# Patient Record
Sex: Male | Born: 1950 | Race: White | Hispanic: No | Marital: Married | Smoking: Former smoker
Health system: Southern US, Community
[De-identification: ages and names within clinical notes are randomized; demographics above are authoritative.]

## PROBLEM LIST (undated history)

## (undated) DIAGNOSIS — Z974 Presence of external hearing-aid: Secondary | ICD-10-CM

## (undated) DIAGNOSIS — B1920 Unspecified viral hepatitis C without hepatic coma: Secondary | ICD-10-CM

## (undated) DIAGNOSIS — J449 Chronic obstructive pulmonary disease, unspecified: Secondary | ICD-10-CM

## (undated) DIAGNOSIS — G4730 Sleep apnea, unspecified: Secondary | ICD-10-CM

## (undated) DIAGNOSIS — C61 Malignant neoplasm of prostate: Secondary | ICD-10-CM

## (undated) DIAGNOSIS — K219 Gastro-esophageal reflux disease without esophagitis: Secondary | ICD-10-CM

## (undated) DIAGNOSIS — G43909 Migraine, unspecified, not intractable, without status migrainosus: Secondary | ICD-10-CM

## (undated) HISTORY — PX: TOTAL KNEE ARTHROPLASTY: SHX125

## (undated) HISTORY — PX: HERNIA REPAIR: SHX51

## (undated) HISTORY — PX: PROSTATE SURGERY: SHX751

## (undated) HISTORY — PX: SEPTOPLASTY: SUR1290

## (undated) HISTORY — PX: HYDROCELE EXCISION / REPAIR: SUR1145

## (undated) HISTORY — PX: CARDIAC ELECTROPHYSIOLOGY STUDY AND ABLATION: SHX1294

---

## 2001-12-10 DIAGNOSIS — B1920 Unspecified viral hepatitis C without hepatic coma: Secondary | ICD-10-CM

## 2001-12-10 HISTORY — DX: Unspecified viral hepatitis C without hepatic coma: B19.20

## 2007-08-05 ENCOUNTER — Ambulatory Visit: Payer: Self-pay | Admitting: Urology

## 2007-08-06 ENCOUNTER — Ambulatory Visit: Payer: Self-pay | Admitting: Urology

## 2008-01-28 IMAGING — CT CT ABDOMEN AND PELVIS WITHOUT AND WITH CONTRAST
2 of 4 series · 13 of 32 positions shown, 18 images · non-contrast
Comparison: none

REASON FOR EXAM: prostate cancer
COMMENTS:

[Series 4: soft tissue with · axial · 0.89mm/px · z∈[-828,-473]mm · 8 of 93 slices shown, 13 images]
[im 11/93  soft-tissue]
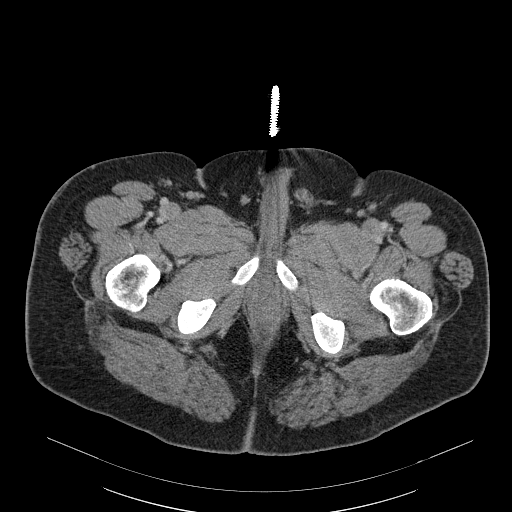
[im 11/93  bone]
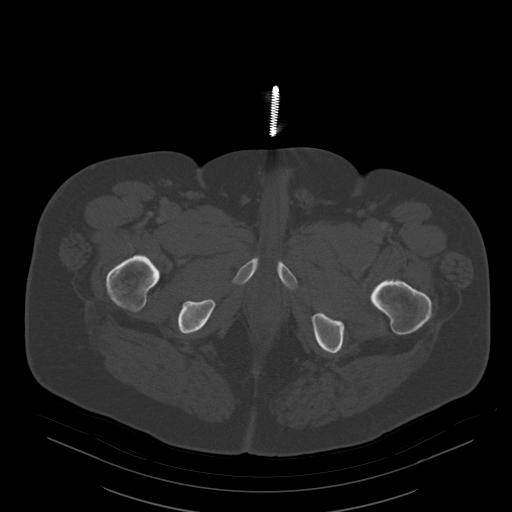
[im 21/93  soft-tissue]
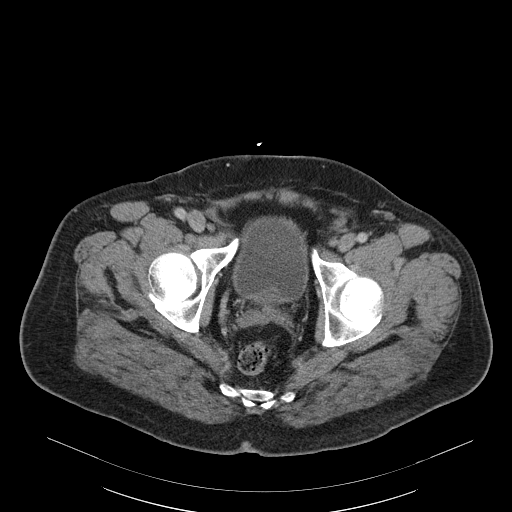
[im 31/93  soft-tissue]
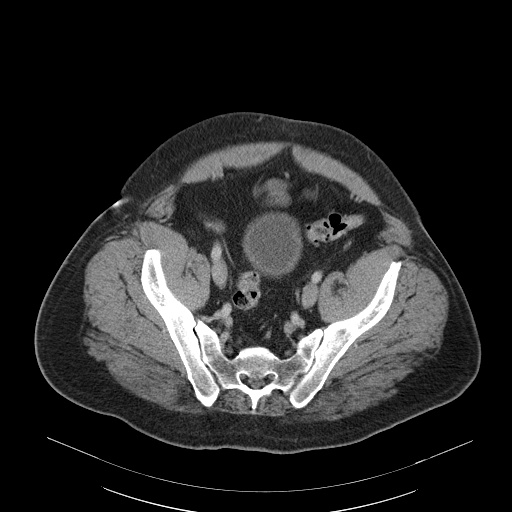
[im 41/93  soft-tissue]
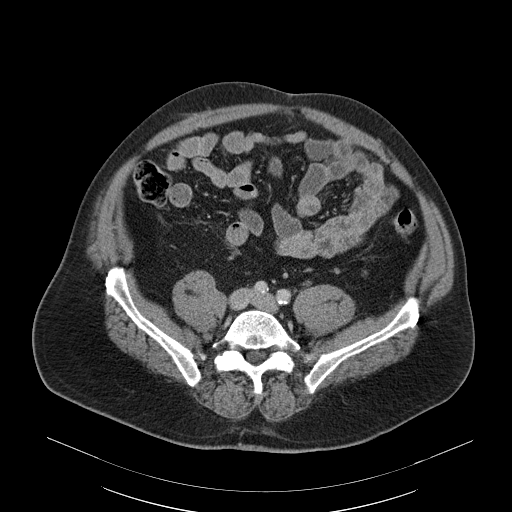
[im 52/93  soft-tissue]
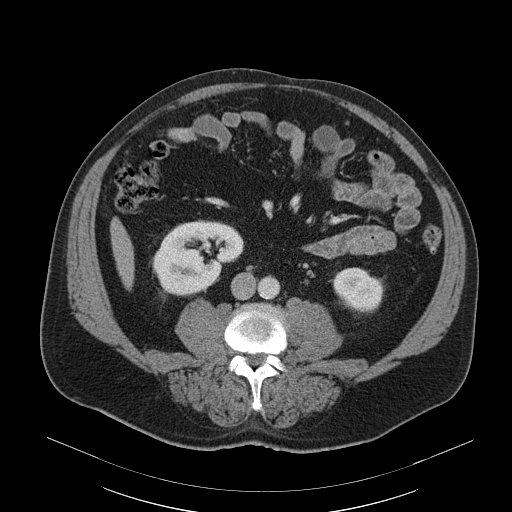
[im 52/93  lung]
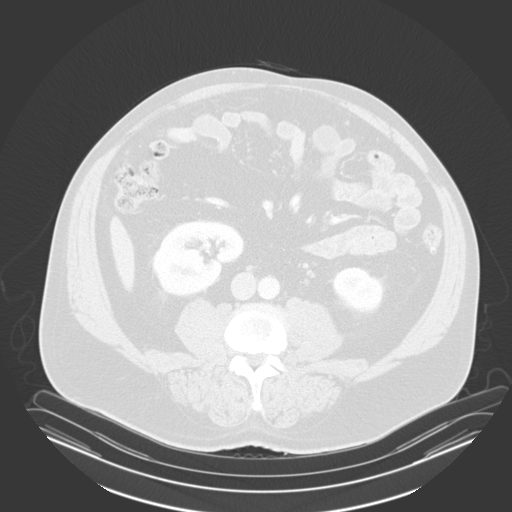
[im 62/93  soft-tissue]
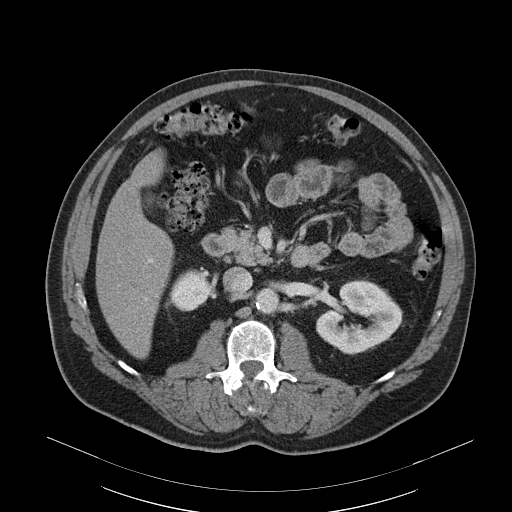
[im 62/93  lung]
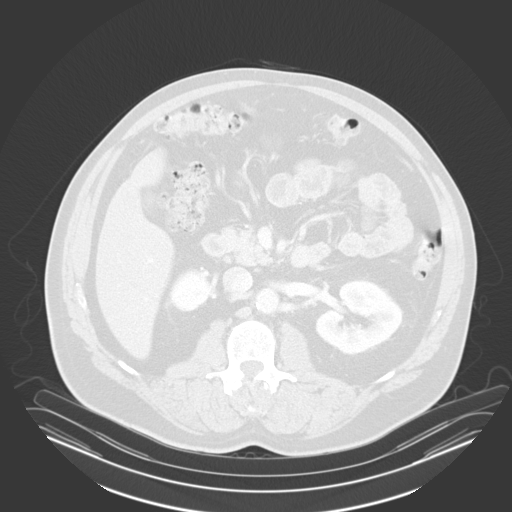
[im 72/93  soft-tissue]
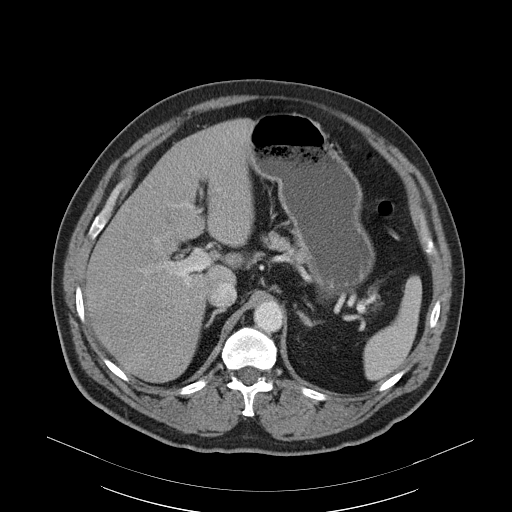
[im 72/93  lung]
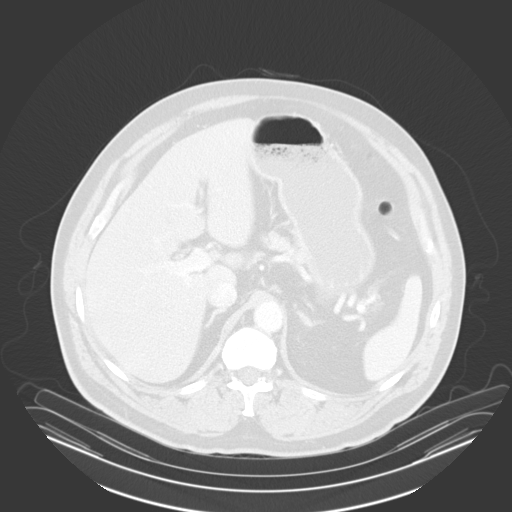
[im 82/93  soft-tissue]
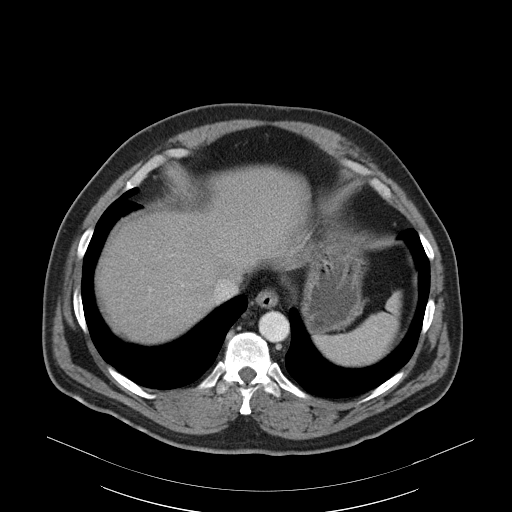
[im 82/93  lung]
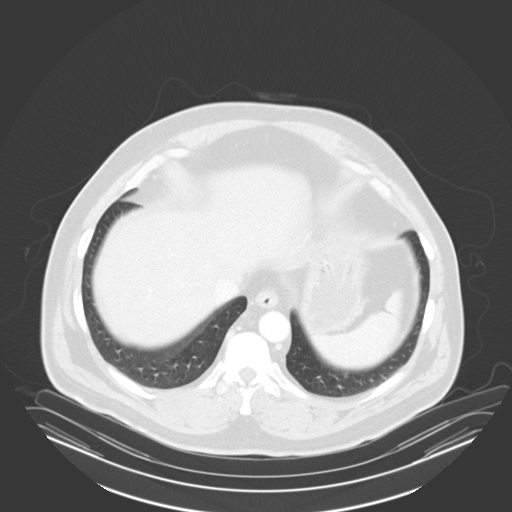

[Series 5: soft tissue delay · axial · delayed · 0.89mm/px · z∈[-828,-623]mm · 5 of 93 slices shown]
[im 11/93  soft-tissue]
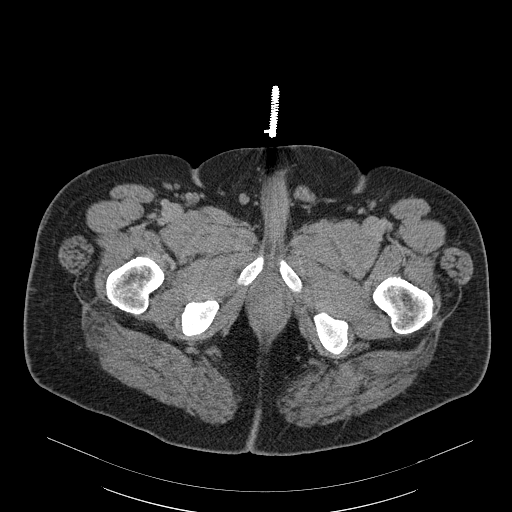
[im 21/93  soft-tissue]
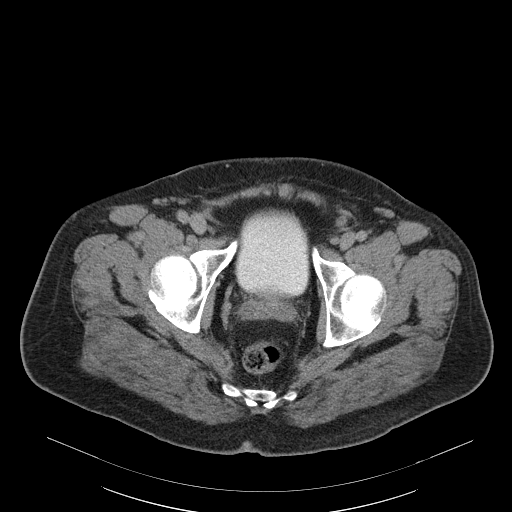
[im 31/93  soft-tissue]
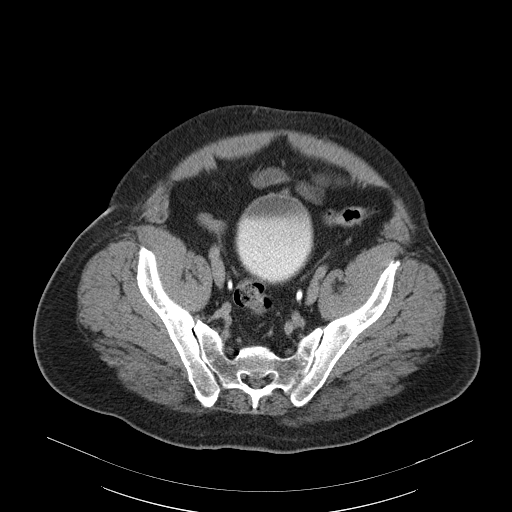
[im 41/93  soft-tissue]
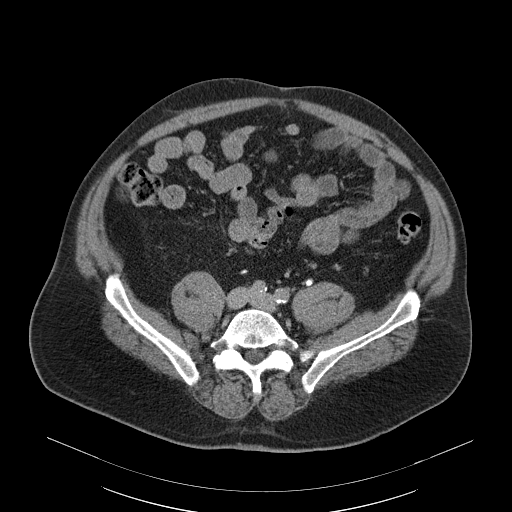
[im 52/93  soft-tissue]
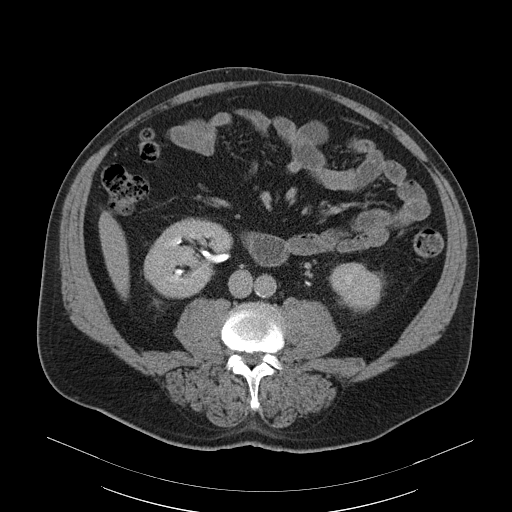

[13 of 32 positions shown; findings below may reference images not displayed]

PROCEDURE:     FANT - FANT ABDOMEN / PELVIS W/WO  - August 06, 2007 [DATE]

RESULT:     The patient has a history of prostatic malignancy. A triphasic
scan was performed as requested. The patient received 100 ml of [id number]
for the postcontrast portions of the exam.

On the pre-contrast images the kidneys exhibit no evidence of obstruction.
No calcified stones are identified. Following contrast administration the
renal parenchyma enhances well. No focal masses are identified. On the
delayed images density of the renal parenchyma is normal. The intrarenal
collecting systems are normal and the ureters are normal in course and
caliber. The prostate gland produces a moderate impression upon the urinary
bladder base. No abnormal enhancement of the urinary bladder wall is seen
and on the delayed images the partially contrast filled urinary bladder is
normal in appearance. I do not see evidence of pelvic lymphadenopathy. No
bulky inguinal lymphadenopathy is identified either. There is no evidence of
ascites. The unopacified loops of small and large bowel exhibit no acute
abnormality. There is a structure demonstrated consistent with a normal
appendix.

The caliber of the abdominal aorta is normal. The stomach is partially
distended with fluid, but grossly normal. The gallbladder is contracted. The
liver, pancreas, and spleen are normal in appearance. There are no adrenal
masses. The lung bases exhibit no acute abnormality. There is evidence of
prior granulomatous infection of the lungs.
IMPRESSION: 1. I do not see acute urinary tract abnormality. The prostate gland is
mildly enlarged and produces a moderate impression upon the urinary bladder
base. No hydronephrosis is seen.
2. The gallbladder is contracted but no calcified stones are demonstrated.
3. I see no acute abnormality of the bowel.
4. There is no evidence of an inguinal or umbilical hernia.
5. The lumbar vertebral bodies are preserved in height. Mild disc space
narrowing is noted in the mid and lower lumbar spine. I do not see lytic or
blastic bony lesions.

## 2008-09-29 ENCOUNTER — Ambulatory Visit: Payer: Self-pay

## 2008-10-12 ENCOUNTER — Ambulatory Visit: Payer: Self-pay | Admitting: Gastroenterology
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
## 2009-11-02 ENCOUNTER — Ambulatory Visit: Payer: Self-pay | Admitting: Family Medicine

## 2009-11-18 ENCOUNTER — Ambulatory Visit: Payer: Self-pay | Admitting: Internal Medicine

## 2010-05-12 IMAGING — CR DG CHEST 2V
1 series · 2 of 2 positions shown · non-contrast
Comparison: none

REASON FOR EXAM: cough
COMMENTS:

PROCEDURE:     MDR - MDR CHEST PA(OR AP) AND LATERAL  - November 18, 2009  [DATE]
RESULT:     The lungs are clear. The cardiac silhouette and visualized bony
skeleton are unremarkable. Calcified lymph nodes identified within the left
hilar region.

[Series 1: view not recorded · 0.17mm/px · 2 of 2 slices shown]
[im 1/2]
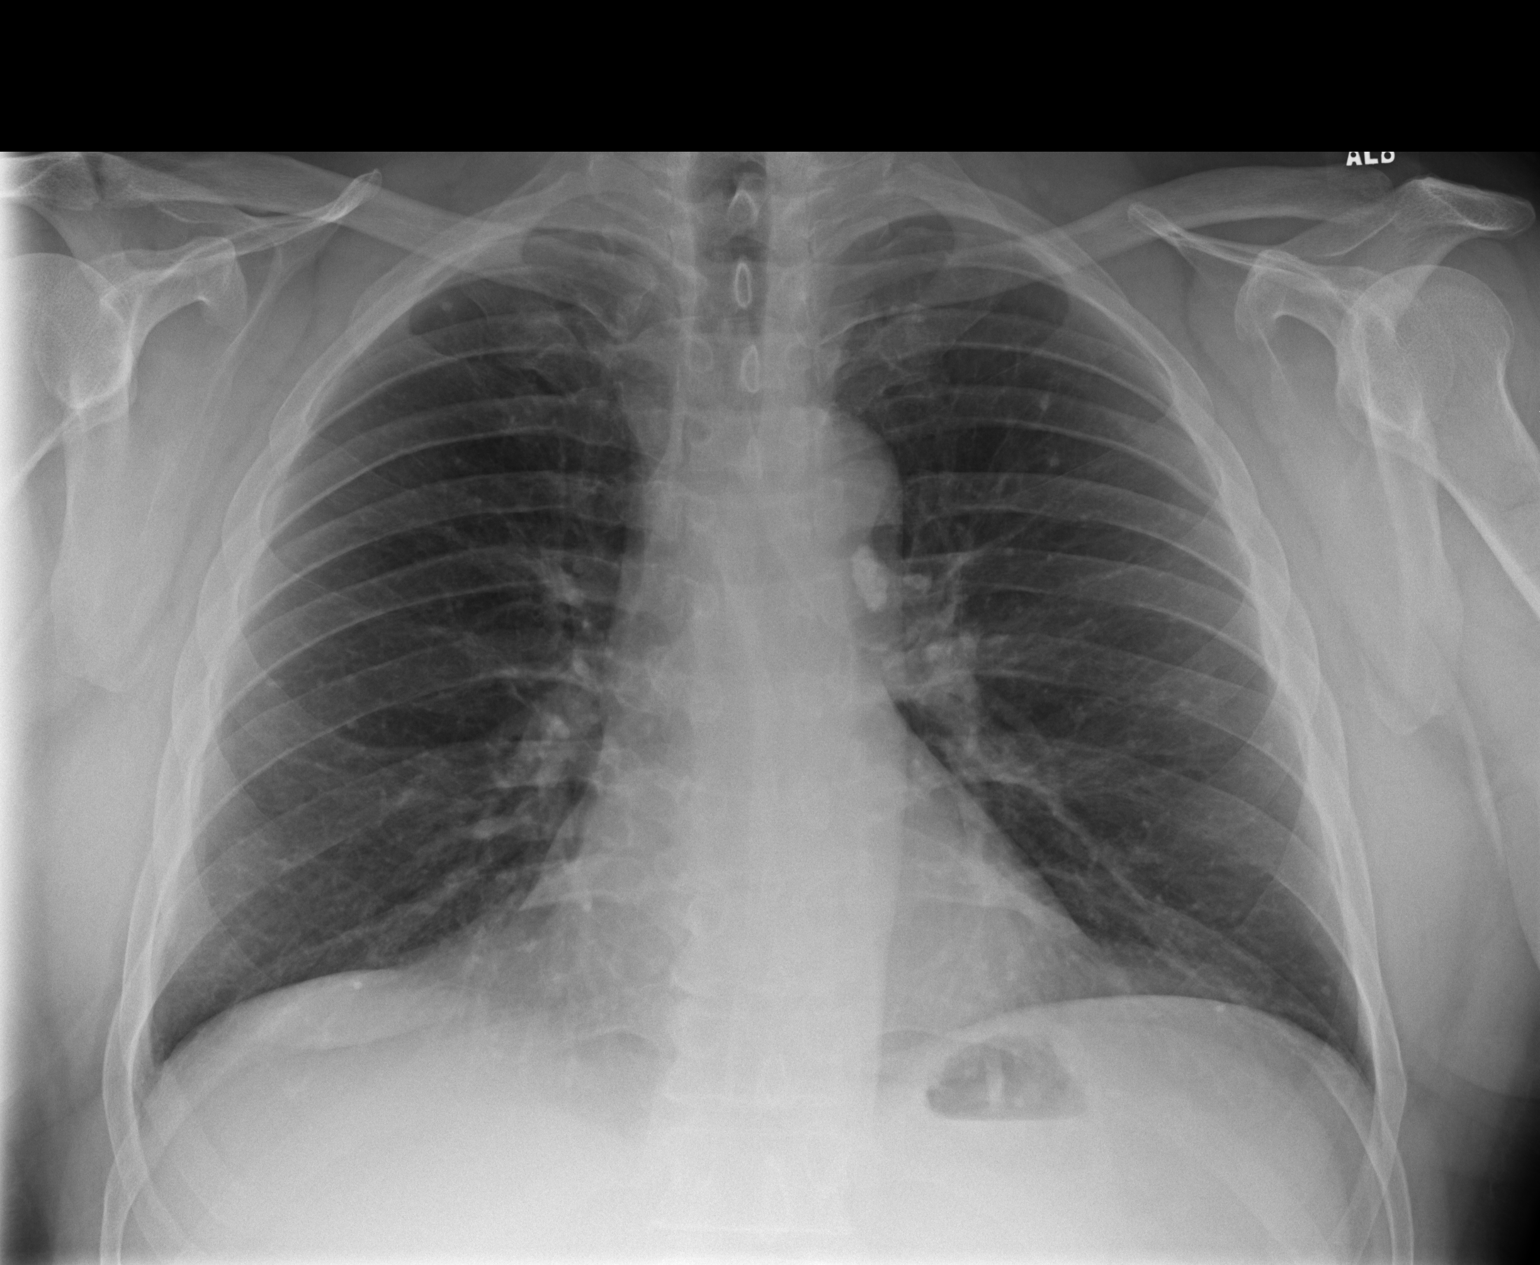
[im 2/2]
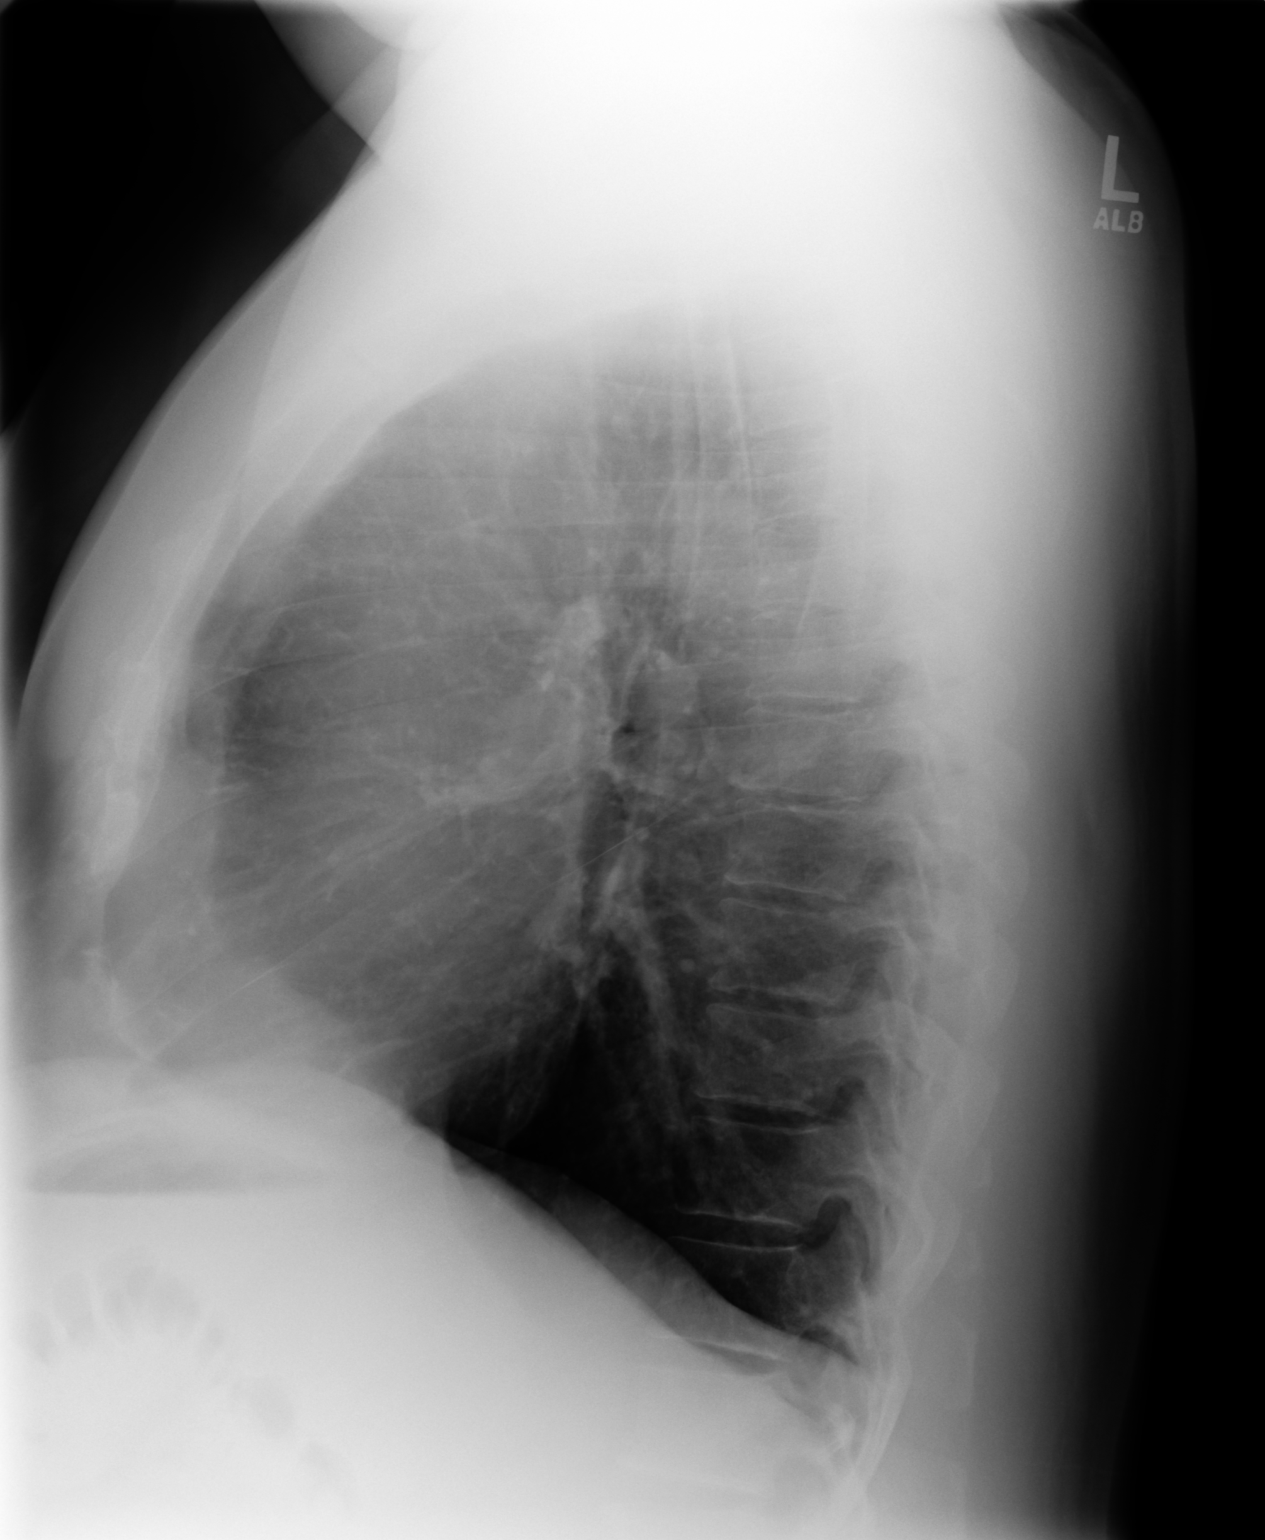

[2 of 2 positions shown; findings below may reference images not displayed]

IMPRESSION: 1. Chest radiograph without evidence of acute cardiopulmonary disease.

## 2013-01-12 ENCOUNTER — Ambulatory Visit: Payer: Self-pay

## 2013-04-06 DIAGNOSIS — C61 Malignant neoplasm of prostate: Secondary | ICD-10-CM | POA: Insufficient documentation

## 2013-05-12 DIAGNOSIS — H9190 Unspecified hearing loss, unspecified ear: Secondary | ICD-10-CM | POA: Insufficient documentation

## 2013-05-12 DIAGNOSIS — G43909 Migraine, unspecified, not intractable, without status migrainosus: Secondary | ICD-10-CM | POA: Insufficient documentation

## 2013-05-12 DIAGNOSIS — B1920 Unspecified viral hepatitis C without hepatic coma: Secondary | ICD-10-CM | POA: Insufficient documentation

## 2014-03-16 DIAGNOSIS — M1712 Unilateral primary osteoarthritis, left knee: Secondary | ICD-10-CM | POA: Insufficient documentation

## 2014-10-15 DIAGNOSIS — S83231A Complex tear of medial meniscus, current injury, right knee, initial encounter: Secondary | ICD-10-CM | POA: Insufficient documentation

## 2015-01-19 DIAGNOSIS — M84376A Stress fracture, unspecified foot, initial encounter for fracture: Secondary | ICD-10-CM | POA: Insufficient documentation

## 2015-01-19 DIAGNOSIS — M722 Plantar fascial fibromatosis: Secondary | ICD-10-CM | POA: Insufficient documentation

## 2016-06-21 DIAGNOSIS — J449 Chronic obstructive pulmonary disease, unspecified: Secondary | ICD-10-CM | POA: Insufficient documentation

## 2018-07-10 ENCOUNTER — Telehealth: Payer: Self-pay | Admitting: Gastroenterology

## 2018-07-10 NOTE — Telephone Encounter (Signed)
Patient LVM that he needed to schedule a colonoscopy and EGD with Dr. Allen Norris

## 2018-07-14 ENCOUNTER — Other Ambulatory Visit: Payer: Self-pay

## 2018-07-14 DIAGNOSIS — Z8719 Personal history of other diseases of the digestive system: Secondary | ICD-10-CM

## 2018-07-14 DIAGNOSIS — Z1211 Encounter for screening for malignant neoplasm of colon: Secondary | ICD-10-CM

## 2018-08-26 ENCOUNTER — Encounter: Payer: Self-pay | Admitting: *Deleted

## 2018-08-26 ENCOUNTER — Other Ambulatory Visit: Payer: Self-pay

## 2018-08-27 ENCOUNTER — Other Ambulatory Visit: Payer: Self-pay

## 2018-08-27 DIAGNOSIS — J45909 Unspecified asthma, uncomplicated: Secondary | ICD-10-CM | POA: Insufficient documentation

## 2018-08-27 DIAGNOSIS — B399 Histoplasmosis, unspecified: Secondary | ICD-10-CM | POA: Insufficient documentation

## 2018-08-27 DIAGNOSIS — K219 Gastro-esophageal reflux disease without esophagitis: Secondary | ICD-10-CM | POA: Insufficient documentation

## 2018-08-27 DIAGNOSIS — R000 Tachycardia, unspecified: Secondary | ICD-10-CM | POA: Insufficient documentation

## 2018-08-27 DIAGNOSIS — G4730 Sleep apnea, unspecified: Secondary | ICD-10-CM | POA: Insufficient documentation

## 2018-08-28 ENCOUNTER — Other Ambulatory Visit: Payer: Self-pay

## 2018-08-28 NOTE — Discharge Instructions (Signed)
General Anesthesia, Adult, Care After °These instructions provide you with information about caring for yourself after your procedure. Your health care provider may also give you more specific instructions. Your treatment has been planned according to current medical practices, but problems sometimes occur. Call your health care provider if you have any problems or questions after your procedure. °What can I expect after the procedure? °After the procedure, it is common to have: °· Vomiting. °· A sore throat. °· Mental slowness. ° °It is common to feel: °· Nauseous. °· Cold or shivery. °· Sleepy. °· Tired. °· Sore or achy, even in parts of your body where you did not have surgery. ° °Follow these instructions at home: °For at least 24 hours after the procedure: °· Do not: °? Participate in activities where you could fall or become injured. °? Drive. °? Use heavy machinery. °? Drink alcohol. °? Take sleeping pills or medicines that cause drowsiness. °? Make important decisions or sign legal documents. °? Take care of children on your own. °· Rest. °Eating and drinking °· If you vomit, drink water, juice, or soup when you can drink without vomiting. °· Drink enough fluid to keep your urine clear or pale yellow. °· Make sure you have little or no nausea before eating solid foods. °· Follow the diet recommended by your health care provider. °General instructions °· Have a responsible adult stay with you until you are awake and alert. °· Return to your normal activities as told by your health care provider. Ask your health care provider what activities are safe for you. °· Take over-the-counter and prescription medicines only as told by your health care provider. °· If you smoke, do not smoke without supervision. °· Keep all follow-up visits as told by your health care provider. This is important. °Contact a health care provider if: °· You continue to have nausea or vomiting at home, and medicines are not helpful. °· You  cannot drink fluids or start eating again. °· You cannot urinate after 8-12 hours. °· You develop a skin rash. °· You have fever. °· You have increasing redness at the site of your procedure. °Get help right away if: °· You have difficulty breathing. °· You have chest pain. °· You have unexpected bleeding. °· You feel that you are having a life-threatening or urgent problem. °This information is not intended to replace advice given to you by your health care provider. Make sure you discuss any questions you have with your health care provider. °Document Released: 03/04/2001 Document Revised: 04/30/2016 Document Reviewed: 11/10/2015 °Elsevier Interactive Patient Education © 2018 [employer] ° °

## 2018-09-01 ENCOUNTER — Ambulatory Visit: Payer: No Typology Code available for payment source | Admitting: Anesthesiology

## 2018-09-01 ENCOUNTER — Ambulatory Visit
Admission: RE | Admit: 2018-09-01 | Discharge: 2018-09-01 | Disposition: A | Discharge status: Home / Self Care | Payer: No Typology Code available for payment source | Source: Ambulatory Visit | Attending: Gastroenterology | Admitting: Gastroenterology

## 2018-09-01 ENCOUNTER — Encounter
Admission: RE | Disposition: A | Discharge status: Home / Self Care | Payer: Self-pay | Source: Ambulatory Visit | Attending: Gastroenterology

## 2018-09-01 DIAGNOSIS — Z8546 Personal history of malignant neoplasm of prostate: Secondary | ICD-10-CM | POA: Insufficient documentation

## 2018-09-01 DIAGNOSIS — I8500 Esophageal varices without bleeding: Secondary | ICD-10-CM

## 2018-09-01 DIAGNOSIS — B1920 Unspecified viral hepatitis C without hepatic coma: Secondary | ICD-10-CM | POA: Diagnosis not present

## 2018-09-01 DIAGNOSIS — Z6836 Body mass index (BMI) 36.0-36.9, adult: Secondary | ICD-10-CM | POA: Diagnosis not present

## 2018-09-01 DIAGNOSIS — K219 Gastro-esophageal reflux disease without esophagitis: Secondary | ICD-10-CM | POA: Insufficient documentation

## 2018-09-01 DIAGNOSIS — K621 Rectal polyp: Secondary | ICD-10-CM | POA: Insufficient documentation

## 2018-09-01 DIAGNOSIS — K5730 Diverticulosis of large intestine without perforation or abscess without bleeding: Secondary | ICD-10-CM | POA: Insufficient documentation

## 2018-09-01 DIAGNOSIS — J449 Chronic obstructive pulmonary disease, unspecified: Secondary | ICD-10-CM | POA: Diagnosis not present

## 2018-09-01 DIAGNOSIS — E669 Obesity, unspecified: Secondary | ICD-10-CM | POA: Insufficient documentation

## 2018-09-01 DIAGNOSIS — K7469 Other cirrhosis of liver: Secondary | ICD-10-CM | POA: Diagnosis not present

## 2018-09-01 DIAGNOSIS — G4730 Sleep apnea, unspecified: Secondary | ICD-10-CM | POA: Diagnosis not present

## 2018-09-01 DIAGNOSIS — Z87891 Personal history of nicotine dependence: Secondary | ICD-10-CM | POA: Insufficient documentation

## 2018-09-01 DIAGNOSIS — K7460 Unspecified cirrhosis of liver: Secondary | ICD-10-CM | POA: Diagnosis not present

## 2018-09-01 DIAGNOSIS — Z79899 Other long term (current) drug therapy: Secondary | ICD-10-CM | POA: Diagnosis not present

## 2018-09-01 DIAGNOSIS — Z1211 Encounter for screening for malignant neoplasm of colon: Secondary | ICD-10-CM

## 2018-09-01 DIAGNOSIS — Z8719 Personal history of other diseases of the digestive system: Secondary | ICD-10-CM

## 2018-09-01 DIAGNOSIS — M1990 Unspecified osteoarthritis, unspecified site: Secondary | ICD-10-CM | POA: Insufficient documentation

## 2018-09-01 DIAGNOSIS — Z9989 Dependence on other enabling machines and devices: Secondary | ICD-10-CM | POA: Insufficient documentation

## 2018-09-01 HISTORY — PX: POLYPECTOMY: SHX5525

## 2018-09-01 HISTORY — DX: Migraine, unspecified, not intractable, without status migrainosus: G43.909

## 2018-09-01 HISTORY — DX: Gastro-esophageal reflux disease without esophagitis: K21.9

## 2018-09-01 HISTORY — PX: COLONOSCOPY WITH PROPOFOL: SHX5780

## 2018-09-01 HISTORY — DX: Malignant neoplasm of prostate: C61

## 2018-09-01 HISTORY — PX: ESOPHAGOGASTRODUODENOSCOPY (EGD) WITH PROPOFOL: SHX5813

## 2018-09-01 HISTORY — DX: Presence of external hearing-aid: Z97.4

## 2018-09-01 HISTORY — DX: Unspecified viral hepatitis C without hepatic coma: B19.20

## 2018-09-01 HISTORY — DX: Chronic obstructive pulmonary disease, unspecified: J44.9

## 2018-09-01 HISTORY — DX: Sleep apnea, unspecified: G47.30

## 2018-09-01 SURGERY — COLONOSCOPY WITH PROPOFOL
Anesthesia: General | Site: Throat

## 2018-09-01 MED ORDER — LACTATED RINGERS IV SOLN
1000.0000 mL | INTRAVENOUS | Status: DC
  Administered 2018-09-01: 1000 mL via INTRAVENOUS

## 2018-09-01 MED ORDER — LIDOCAINE HCL (CARDIAC) PF 100 MG/5ML IV SOSY
PREFILLED_SYRINGE | INTRAVENOUS | Status: DC | PRN
  Administered 2018-09-01: 40 mg via INTRAVENOUS

## 2018-09-01 MED ORDER — STERILE WATER FOR IRRIGATION IR SOLN
Status: DC | PRN
  Administered 2018-09-01: .05 mL

## 2018-09-01 MED ORDER — GLYCOPYRROLATE 0.2 MG/ML IJ SOLN
INTRAMUSCULAR | Status: DC | PRN
  Administered 2018-09-01: 0.1 mg via INTRAVENOUS

## 2018-09-01 MED ORDER — PROPOFOL 10 MG/ML IV BOLUS
INTRAVENOUS | Status: DC | PRN
  Administered 2018-09-01: 100 mg via INTRAVENOUS
  Administered 2018-09-01: 20 mg via INTRAVENOUS
  Administered 2018-09-01: 40 mg via INTRAVENOUS
  Administered 2018-09-01: 20 mg via INTRAVENOUS
  Administered 2018-09-01: 50 mg via INTRAVENOUS
  Administered 2018-09-01: 20 mg via INTRAVENOUS

## 2018-09-01 MED ORDER — ONDANSETRON HCL 4 MG/2ML IJ SOLN: 4.0000 mg | Freq: Once | INTRAMUSCULAR | Status: DC | PRN

## 2018-09-01 MED ORDER — SODIUM CHLORIDE 0.9 % IV SOLN: INTRAVENOUS | Status: DC

## 2018-09-01 SURGICAL SUPPLY — 7 items
BLOCK BITE 60FR ADLT L/F GRN (MISCELLANEOUS) ×5 IMPLANT
CANISTER SUCT 1200ML W/VALVE (MISCELLANEOUS) ×5 IMPLANT
FORCEPS BIOP RAD 4 LRG CAP 4 (CUTTING FORCEPS) ×5 IMPLANT
GOWN CVR UNV OPN BCK APRN NK (MISCELLANEOUS) ×6 IMPLANT
GOWN ISOL THUMB LOOP REG UNIV (MISCELLANEOUS) ×4
KIT ENDO PROCEDURE OLY (KITS) ×5 IMPLANT
WATER STERILE IRR 250ML POUR (IV SOLUTION) ×5 IMPLANT

## 2018-09-01 NOTE — Transfer of Care (Addendum)
Immediate Anesthesia Transfer of Care Note  Patient: Paul Larson  Procedure(s) Performed: COLONOSCOPY WITH PROPOFOL (N/A Rectum) ESOPHAGOGASTRODUODENOSCOPY (EGD) WITH PROPOFOL (N/A Throat) POLYPECTOMY (Rectum)  Patient Location: PACU  Anesthesia Type: General  Level of Consciousness: awake, alert  and patient cooperative  Airway and Oxygen Therapy: Patient Spontanous Breathing and Patient connected to supplemental oxygen  Post-op Assessment: Post-op Vital signs reviewed, Patient's Cardiovascular Status Stable, Respiratory Function Stable, Patent Airway and No signs of Nausea or vomiting  Post-op Vital Signs: Reviewed and stable  Complications: No apparent anesthesia complications

## 2018-09-01 NOTE — Op Note (Signed)
[facility] Patient Name: Paul Larson Procedure Date: 09/01/2018 8:38 AM MRN: [id number] Account #: [id number] Date of Birth: 12-20-1950 Admit Type: Outpatient Age: 67 Room: [facility] OR ROOM 01 Gender: Male Note Status: Finalized Procedure:            Upper GI endoscopy Indications:          Cirrhosis rule out esophageal varices Providers:            Lucilla Lame MD, MD Referring MD:         [facility], MD (Referring MD) Medicines:            Propofol per Anesthesia Complications:        No immediate complications. Procedure:            Pre-Anesthesia Assessment:                       - Prior to the procedure, a History and Physical was                        performed, and patient medications and allergies were                        reviewed. The patient's tolerance of previous                        anesthesia was also reviewed. The risks and benefits of                        the procedure and the sedation options and risks were                        discussed with the patient. All questions were                        answered, and informed consent was obtained. Prior                        Anticoagulants: The patient has taken no previous                        anticoagulant or antiplatelet agents. ASA Grade                        Assessment: II - A patient with mild systemic disease.                        After reviewing the risks and benefits, the patient was                        deemed in satisfactory condition to undergo the                        procedure.                       After obtaining informed consent, the endoscope was                        passed under direct vision. Throughout the procedure,  the patient's blood pressure, pulse, and oxygen                        saturations were monitored continuously. The was                        introduced through the mouth, and advanced to the                      second part of duodenum. The upper GI endoscopy was                        accomplished without difficulty. The patient tolerated                        the procedure well. Findings:      The esophagus was normal.      The stomach was normal.      The examined duodenum was normal. Impression:           - Normal esophagus.                       - Normal stomach.                       - Normal examined duodenum.                       - No specimens collected. Recommendation:       - Discharge patient to home.                       - Resume previous diet.                       - Continue present medications.                       - Perform a colonoscopy today.                       - Repeat upper endoscopy in 2 years for surveillance. Procedure Code(s):    --- Professional ---                       [phone], Esophagogastroduodenoscopy, flexible, transoral;                        diagnostic, including collection of specimen(s) by                        brushing or washing, when performed (separate procedure) Diagnosis Code(s):    --- Professional ---                       K74.60, Unspecified cirrhosis of liver CPT copyright 2017 American Medical Association. All rights reserved. The codes documented in this report are preliminary and upon coder review may  be revised to meet current compliance requirements. Lucilla Lame MD, MD 09/01/2018 8:49:22 AM This report has been signed electronically. Number of Addenda: 0 Note Initiated On: 09/01/2018 8:38 AM Total Procedure Duration: 0 hours 1 minute 34 seconds       [facility]

## 2018-09-01 NOTE — H&P (Signed)
Lucilla Lame, MD [address] [place], [place] [address] [phone] Fax : [phone]  Primary Care Physician:  [facility] Primary Gastroenterologist:  Dr. Allen Norris  Pre-Procedure History & Physical: HPI:  Paul Larson is a 67 y.o. male is here for an endoscopy and colonoscopy.   Past Medical History:  Diagnosis Date  . COPD (chronic obstructive pulmonary disease) ([place])   . GERD (gastroesophageal reflux disease)   . Hepatitis C 2003   took treatment  . Migraine headache   . Prostate cancer ([place])   . Sleep apnea    CPAP  . Wears hearing aid in both ears     Past Surgical History:  Procedure Laterality Date  . CARDIAC ELECTROPHYSIOLOGY STUDY AND ABLATION    . HERNIA REPAIR     umbilical  . HYDROCELE EXCISION / REPAIR    . PROSTATE SURGERY    . SEPTOPLASTY    . TOTAL KNEE ARTHROPLASTY Left     Prior to Admission medications   Medication Sig Start Date End Date Taking? Authorizing Provider  albuterol (PROVENTIL HFA;VENTOLIN HFA) 108 (90 Base) MCG/ACT inhaler Inhale into the lungs every 6 (six) hours as needed for wheezing or shortness of breath.   Yes [provider]  MILK THISTLE PO Take 525 mg by mouth daily.   Yes [provider]  Multiple Vitamin (MULTIVITAMIN) tablet Take 1 tablet by mouth daily.   Yes [provider]  Omega-3 Fatty Acids (OMEGA 3 PO) Take by mouth daily.   Yes [provider]  propranolol (INDERAL) 40 MG tablet Take 20 mg by mouth daily.   Yes [provider]  SUMAtriptan (IMITREX) 100 MG tablet Take 100 mg by mouth every 2 (two) hours as needed for migraine. May repeat in 2 hours if headache persists or recurs.   Yes [provider]  Tiotropium Bromide-Olodaterol (STIOLTO RESPIMAT) 2.5-2.5 MCG/ACT AERS Inhale into the lungs daily.   Yes [provider]    Allergies as of 07/14/2018  . (Not on File)    History reviewed. No pertinent family  history.  Social History   Socioeconomic History  . Marital status: Married    Spouse name: Not on file  . Number of children: Not on file  . Years of education: Not on file  . Highest education level: Not on file  Occupational History  . Not on file  Social Needs  . Financial resource strain: Not on file  . Food insecurity:    Worry: Not on file    Inability: Not on file  . Transportation needs:    Medical: Not on file    Non-medical: Not on file  Tobacco Use  . Smoking status: Former Smoker    Last attempt to quit: 05/24/2004    Years since quitting: 14.2  . Smokeless tobacco: Never Used  Substance and Sexual Activity  . Alcohol use: Not Currently    Comment: none since 2001  . Drug use: Not on file  . Sexual activity: Not on file  Lifestyle  . Physical activity:    Days per week: Not on file    Minutes per session: Not on file  . Stress: Not on file  Relationships  . Social connections:    Talks on phone: Not on file    Gets together: Not on file    Attends religious service: Not on file    Active member of club or organization: Not on file    Attends meetings  of clubs or organizations: Not on file    Relationship status: Not on file  . Intimate partner violence:    Fear of current or ex partner: Not on file    Emotionally abused: Not on file    Physically abused: Not on file    Forced sexual activity: Not on file  Other Topics Concern  . Not on file  Social History Narrative  . Not on file    Review of Systems: See HPI, otherwise negative ROS  Physical Exam: BP (!) 140/96   Pulse 92   Temp 97.7 F (36.5 C) (Temporal)   Resp 16   Ht 5\' 10"  (1.778 m)   Wt 114.3 kg   SpO2 98%   BMI 36.16 kg/m  General:   Alert,  pleasant and cooperative in NAD Head:  Normocephalic and atraumatic. Neck:  Supple; no masses or thyromegaly. Lungs:  Clear throughout to auscultation.    Heart:  Regular rate and rhythm. Abdomen:  Soft, nontender and nondistended.  Normal bowel sounds, without guarding, and without rebound.   Neurologic:  Alert and  oriented x4;  grossly normal neurologically.  Impression/Plan: Paul Larson is here for an endoscopy and colonoscopy to be performed for cirrhosis and screening colonoscopy.  Risks, benefits, limitations, and alternatives regarding  endoscopy and colonoscopy have been reviewed with the patient.  Questions have been answered.  All parties agreeable.   Lucilla Lame, MD  09/01/2018, 8:04 AM

## 2018-09-01 NOTE — Op Note (Signed)
[facility] Patient Name: Paul Larson Procedure Date: 09/01/2018 8:38 AM MRN: [id number] Account #: [id number] Date of Birth: [birth year] Admit Type: Outpatient Age: 67 Room: [facility] OR ROOM 01 Gender: Male Note Status: Finalized Procedure:            Colonoscopy Indications:          Screening for colorectal malignant neoplasm Providers:            Lucilla Lame MD, MD Medicines:            Propofol per Anesthesia Complications:        No immediate complications. Procedure:            Pre-Anesthesia Assessment:                       - Prior to the procedure, a History and Physical was                        performed, and patient medications and allergies were                        reviewed. The patient's tolerance of previous                        anesthesia was also reviewed. The risks and benefits of                        the procedure and the sedation options and risks were                        discussed with the patient. All questions were                        answered, and informed consent was obtained. Prior                        Anticoagulants: The patient has taken no previous                        anticoagulant or antiplatelet agents. ASA Grade                        Assessment: II - A patient with mild systemic disease.                        After reviewing the risks and benefits, the patient was                        deemed in satisfactory condition to undergo the                        procedure.                       After obtaining informed consent, the colonoscope was                        passed under direct vision. Throughout the procedure,                        the patient's blood pressure,  pulse, and oxygen                        saturations were monitored continuously. The was                        introduced through the anus and advanced to the the                        cecum, identified by appendiceal orifice and  ileocecal                        valve. The colonoscopy was performed without                        difficulty. The patient tolerated the procedure well.                        The quality of the bowel preparation was excellent. Findings:      The perianal and digital rectal examinations were normal.      Two sessile polyps were found in the rectum. The polyps were 1 to 3 mm       in size. These polyps were removed with a cold biopsy forceps. Resection       and retrieval were complete.      Multiple small-mouthed diverticula were found in the sigmoid colon. Impression:           - Two 1 to 3 mm polyps in the rectum, removed with a                        cold biopsy forceps. Resected and retrieved.                       - Diverticulosis in the sigmoid colon. Recommendation:       - Discharge patient to home.                       - Resume previous diet.                       - Continue present medications.                       - Await pathology results.                       - Repeat colonoscopy in 5 years if polyp adenoma and 10                        years if hyperplastic Procedure Code(s):    --- Professional ---                       [phone], Colonoscopy, flexible; with biopsy, single or                        multiple Diagnosis Code(s):    --- Professional ---                       Z12.11, Encounter for screening for malignant neoplasm  of colon                       K62.1, Rectal polyp CPT copyright 2017 American Medical Association. All rights reserved. The codes documented in this report are preliminary and upon coder review may  be revised to meet current compliance requirements. Lucilla Lame MD, MD 09/01/2018 9:04:18 AM This report has been signed electronically. Number of Addenda: 0 Note Initiated On: 09/01/2018 8:38 AM Scope Withdrawal Time: 0 hours 8 minutes 19 seconds  Total Procedure Duration: 0 hours 12 minutes 6 seconds       [facility]

## 2018-09-01 NOTE — Anesthesia Procedure Notes (Signed)
Performed by: Dub Maclellan, CRNA Pre-anesthesia Checklist: Patient identified, Emergency Drugs available, Suction available, Timeout performed and Patient being monitored Patient Re-evaluated:Patient Re-evaluated prior to induction Oxygen Delivery Method: Nasal cannula Placement Confirmation: positive ETCO2       

## 2018-09-01 NOTE — Anesthesia Postprocedure Evaluation (Signed)
Anesthesia Post Note  Patient: Paul Larson  Procedure(s) Performed: COLONOSCOPY WITH PROPOFOL (N/A Rectum) ESOPHAGOGASTRODUODENOSCOPY (EGD) WITH PROPOFOL (N/A Throat) POLYPECTOMY (Rectum)  Patient location during evaluation: PACU Anesthesia Type: General Level of consciousness: awake Pain management: pain level controlled Vital Signs Assessment: post-procedure vital signs reviewed and stable Respiratory status: respiratory function stable Cardiovascular status: stable Postop Assessment: no signs of nausea or vomiting Anesthetic complications: no    Veda Canning

## 2018-09-01 NOTE — Anesthesia Preprocedure Evaluation (Addendum)
Anesthesia Evaluation  Patient identified by MRN, date of birth, ID band Patient awake    Reviewed: Allergy & Precautions, NPO status , Patient's Chart, lab work & pertinent test results  Airway Mallampati: II  TM Distance: >3 FB    Comment: Beard Dental  (+) Teeth Intact   Pulmonary sleep apnea and Continuous Positive Airway Pressure Ventilation , COPD, former smoker,    breath sounds clear to auscultation       Cardiovascular  Rhythm:Regular Rate:Normal     Neuro/Psych  Headaches,    GI/Hepatic GERD  ,(+) Hepatitis - (C, treated)  Endo/Other  Obesity - BMI 36   Renal/GU      Musculoskeletal  (+) Arthritis ,   Abdominal   Peds  Hematology   Anesthesia Other Findings   Reproductive/Obstetrics                            Anesthesia Physical Anesthesia Plan  ASA: III  Anesthesia Plan: General   Post-op Pain Management:    Induction: Intravenous  PONV Risk Score and Plan:   Airway Management Planned: Simple Face Mask and Natural Airway  Additional Equipment:   Intra-op Plan:   Post-operative Plan:   Informed Consent: I have reviewed the patients History and Physical, chart, labs and discussed the procedure including the risks, benefits and alternatives for the proposed anesthesia with the patient or authorized representative who has indicated his/her understanding and acceptance.   Dental advisory given  Plan Discussed with: CRNA  Anesthesia Plan Comments:         Anesthesia Quick Evaluation

## 2018-09-02 ENCOUNTER — Encounter: Payer: Self-pay | Admitting: Gastroenterology

## 2018-09-19 ENCOUNTER — Encounter: Payer: Self-pay | Admitting: Gastroenterology

## 2018-09-21 ENCOUNTER — Encounter: Payer: Self-pay | Admitting: Gastroenterology

## 2018-11-17 ENCOUNTER — Telehealth: Payer: Self-pay | Admitting: Gastroenterology

## 2018-11-17 NOTE — Telephone Encounter (Signed)
Pt wife is calling to receive 2 letters for pt that states  He had a procedure done on  9/23/2019so her insurance will cover it  On a [employer] letter head. She will come by and pick them up any question you can call pt wife

## 2018-11-18 NOTE — Telephone Encounter (Signed)
LVM for pt to return my call.

## 2018-11-20 NOTE — Telephone Encounter (Signed)
Pt's wife returned call and explained what kind of letter she was needing. Letter completed and put upfront for pickup.

## 2022-08-08 ENCOUNTER — Ambulatory Visit (INDEPENDENT_AMBULATORY_CARE_PROVIDER_SITE_OTHER): Payer: Medicare PPO | Admitting: Dermatology

## 2022-08-08 ENCOUNTER — Encounter: Payer: Self-pay | Admitting: Dermatology

## 2022-08-08 DIAGNOSIS — L570 Actinic keratosis: Secondary | ICD-10-CM

## 2022-08-08 DIAGNOSIS — D1800 Hemangioma unspecified site: Secondary | ICD-10-CM

## 2022-08-08 DIAGNOSIS — L578 Other skin changes due to chronic exposure to nonionizing radiation: Secondary | ICD-10-CM

## 2022-08-08 DIAGNOSIS — L814 Other melanin hyperpigmentation: Secondary | ICD-10-CM

## 2022-08-08 DIAGNOSIS — L609 Nail disorder, unspecified: Secondary | ICD-10-CM | POA: Diagnosis not present

## 2022-08-08 DIAGNOSIS — L820 Inflamed seborrheic keratosis: Secondary | ICD-10-CM | POA: Diagnosis not present

## 2022-08-08 DIAGNOSIS — D229 Melanocytic nevi, unspecified: Secondary | ICD-10-CM

## 2022-08-08 DIAGNOSIS — Z1283 Encounter for screening for malignant neoplasm of skin: Secondary | ICD-10-CM

## 2022-08-08 DIAGNOSIS — L719 Rosacea, unspecified: Secondary | ICD-10-CM

## 2022-08-08 DIAGNOSIS — L821 Other seborrheic keratosis: Secondary | ICD-10-CM

## 2022-08-08 DIAGNOSIS — I8393 Asymptomatic varicose veins of bilateral lower extremities: Secondary | ICD-10-CM

## 2022-08-08 NOTE — Progress Notes (Signed)
New Patient Visit  Subjective  Paul Larson is a 71 y.o. male who presents for the following: Annual Exam (No fhx of skin cancer or dysplastic nevi ). The patient presents for Total-Body Skin Exam (TBSE) for skin cancer screening and mole check.  The patient has spots, moles and lesions to be evaluated, some may be new or changing and the patient has concerns that these could be cancer.  The following portions of the chart were reviewed this encounter and updated as appropriate:   Tobacco  Allergies  Meds  Problems  Med Hx  Surg Hx  Fam Hx     Review of Systems:  No other skin or systemic complaints except as noted in HPI or Assessment and Plan.  Objective  Well appearing patient in no apparent distress; mood and affect are within normal limits.  A full examination was performed including scalp, head, eyes, ears, nose, lips, neck, chest, axillae, abdomen, back, buttocks, bilateral upper extremities, bilateral lower extremities, hands, feet, fingers, toes, fingernails, and toenails. All findings within normal limits unless otherwise noted below.  L temple x 1 Erythematous thin papules/macules with gritty scale.   L ant shoulder x 1 Erythematous stuck-on, waxy papule or plaque  Face Mid face erythema with telangiectasias +/- scattered inflammatory papules.   L great toenail L great toenail dystrophy    Assessment & Plan  AK (actinic keratosis) L temple x 1 Destruction of lesion - L temple x 1 Complexity: simple   Destruction method: cryotherapy   Informed consent: discussed and consent obtained   Timeout:  patient name, date of birth, surgical site, and procedure verified Lesion destroyed using liquid nitrogen: Yes   Region frozen until ice ball extended beyond lesion: Yes   Outcome: patient tolerated procedure well with no complications   Post-procedure details: wound care instructions given    Inflamed seborrheic keratosis L ant shoulder x 1 Symptomatic,  irritating, patient would like treated. Destruction of lesion - L ant shoulder x 1 Complexity: simple   Destruction method: cryotherapy   Informed consent: discussed and consent obtained   Timeout:  patient name, date of birth, surgical site, and procedure verified Lesion destroyed using liquid nitrogen: Yes   Region frozen until ice ball extended beyond lesion: Yes   Outcome: patient tolerated procedure well with no complications   Post-procedure details: wound care instructions given    Rosacea Face Rosacea is a chronic progressive skin condition usually affecting the face of adults, causing redness and/or acne bumps. It is treatable but not curable. It sometimes affects the eyes (ocular rosacea) as well. It may respond to topical and/or systemic medication and can flare with stress, sun exposure, alcohol, exercise and some foods.  Daily application of broad spectrum spf 30+ sunscreen to face is recommended to reduce flares.  Nail problem L great toenail Trauma (vs less likely tinea unguium)  Benign-appearing.  Observation.  Call clinic for new or changing lesions.  Recommend daily use of broad spectrum spf 30+ sunscreen to sun-exposed areas.  - no treatment needed  Lentigines - Scattered tan macules - Due to sun exposure - Benign-appearing, observe - Recommend daily broad spectrum sunscreen SPF 30+ to sun-exposed areas, reapply every 2 hours as needed. - Call for any changes  Seborrheic Keratoses - Stuck-on, waxy, tan-brown papules and/or plaques  - Benign-appearing - Discussed benign etiology and prognosis. - Observe - Call for any changes  Melanocytic Nevi - Tan-brown and/or pink-flesh-colored symmetric macules and papules - Benign appearing  on exam today - Observation - Call clinic for new or changing moles - Recommend daily use of broad spectrum spf 30+ sunscreen to sun-exposed areas.   Hemangiomas - Red papules - Discussed benign nature - Observe - Call for any  changes  Actinic Damage - Chronic condition, secondary to cumulative UV/sun exposure - diffuse scaly erythematous macules with underlying dyspigmentation - Recommend daily broad spectrum sunscreen SPF 30+ to sun-exposed areas, reapply every 2 hours as needed.  - Staying in the shade or wearing long sleeves, sun glasses (UVA+UVB protection) and wide brim hats (4-inch brim around the entire circumference of the hat) are also recommended for sun protection.  - Call for new or changing lesions.  Varicose Veins/Spider Veins - Dilated blue, purple or red veins at the lower extremities - Reassured - Smaller vessels can be treated by sclerotherapy (a procedure to inject a medicine into the veins to make them disappear) if desired, but the treatment is not covered by insurance. Larger vessels may be covered if symptomatic and we would refer to vascular surgeon if treatment desired.  Acrochordons (Skin Tags) - Fleshy, skin-colored pedunculated papules - Benign appearing.  - Observe. - If desired, they can be removed with an in office procedure that is not covered by insurance. - Please call the clinic if you notice any new or changing lesions.  Skin cancer screening performed today.  Return in about 1 year (around 08/09/2023) for TBSE.  Luther Redo, CMA, am acting as scribe for Sarina Ser, MD . Documentation: I have reviewed the above documentation for accuracy and completeness, and I agree with the above.  Sarina Ser, MD

## 2022-08-08 NOTE — Patient Instructions (Signed)
Due to recent changes in healthcare laws, you may see results of your pathology and/or laboratory studies on MyChart before the doctors have had a chance to review them. We understand that in some cases there may be results that are confusing or concerning to you. Please understand that not all results are received at the same time and often the doctors may need to interpret multiple results in order to provide you with the best plan of care or course of treatment. Therefore, we ask that you please give us 2 business days to thoroughly review all your results before contacting the office for clarification. Should we see a critical lab result, you will be contacted sooner.   If You Need Anything After Your Visit  If you have any questions or concerns for your doctor, please call our main line at [phone] and press option 4 to reach your doctor's medical assistant. If no one answers, please leave a voicemail as directed and we will return your call as soon as possible. Messages left after 4 pm will be answered the following business day.   You may also send us a message via MyChart. We typically respond to MyChart messages within 1-2 business days.  For prescription refills, please ask your pharmacy to contact our office. Our fax number is [phone].  If you have an urgent issue when the clinic is closed that cannot wait until the next business day, you can page your doctor at the number below.    Please note that while we do our best to be available for urgent issues outside of office hours, we are not available 24/7.   If you have an urgent issue and are unable to reach us, you may choose to seek medical care at your doctor's office, retail clinic, urgent care center, or emergency room.  If you have a medical emergency, please immediately call 911 or go to the emergency department.  Pager Numbers  - Dr. Kowalski: [phone]  - Dr. Moye: [phone]  - Dr. Stewart:  [phone]  In the event of inclement weather, please call our main line at [phone] for an update on the status of any delays or closures.  Dermatology Medication Tips: Please keep the boxes that topical medications come in in order to help keep track of the instructions about where and how to use these. Pharmacies typically print the medication instructions only on the boxes and not directly on the medication tubes.   If your medication is too expensive, please contact our office at [phone] option 4 or send us a message through MyChart.   We are unable to tell what your co-pay for medications will be in advance as this is different depending on your insurance coverage. However, we may be able to find a substitute medication at lower cost or fill out paperwork to get insurance to cover a needed medication.   If a prior authorization is required to get your medication covered by your insurance company, please allow us 1-2 business days to complete this process.  Drug prices often vary depending on where the prescription is filled and some pharmacies may offer cheaper prices.  The website www.goodrx.com contains coupons for medications through different pharmacies. The prices here do not account for what the cost may be with help from insurance (it may be cheaper with your insurance), but the website can give you the price if you did not use any insurance.  - You can print the associated coupon and take it with   your prescription to the pharmacy.  - You may also stop by our office during regular business hours and pick up a GoodRx coupon card.  - If you need your prescription sent electronically to a different pharmacy, notify our office through [facility] MyChart or by phone at [phone] option 4.     Si Usted Necesita Algo Despus de Su Visita  Tambin puede enviarnos un mensaje a travs de MyChart. Por lo general respondemos a los mensajes de MyChart en el transcurso de 1 a 2  das hbiles.  Para renovar recetas, por favor pida a su farmacia que se ponga en contacto con nuestra oficina. Nuestro nmero de fax es el [phone].  Si tiene un asunto urgente cuando la clnica est cerrada y que no puede esperar hasta el siguiente da hbil, puede llamar/localizar a su doctor(a) al nmero que aparece a continuacin.   Por favor, tenga en cuenta que aunque hacemos todo lo posible para estar disponibles para asuntos urgentes fuera del horario de oficina, no estamos disponibles las 24 horas del da, los 7 das de la semana.   Si tiene un problema urgente y no puede comunicarse con nosotros, puede optar por buscar atencin mdica  en el consultorio de su doctor(a), en una clnica privada, en un centro de atencin urgente o en una sala de emergencias.  Si tiene una emergencia mdica, por favor llame inmediatamente al 911 o vaya a la sala de emergencias.  Nmeros de bper  - Dr. Kowalski: [phone]  - Dra. Moye: [phone]  - Dra. Stewart: [phone]  En caso de inclemencias del tiempo, por favor llame a nuestra lnea principal al [phone] para una actualizacin sobre el estado de cualquier retraso o cierre.  Consejos para la medicacin en dermatologa: Por favor, guarde las cajas en las que vienen los medicamentos de uso tpico para ayudarle a seguir las instrucciones sobre dnde y cmo usarlos. Las farmacias generalmente imprimen las instrucciones del medicamento slo en las cajas y no directamente en los tubos del medicamento.   Si su medicamento es muy caro, por favor, pngase en contacto con nuestra oficina llamando al [phone] y presione la opcin 4 o envenos un mensaje a travs de MyChart.   No podemos decirle cul ser su copago por los medicamentos por adelantado ya que esto es diferente dependiendo de la cobertura de su seguro. Sin embargo, es posible que podamos encontrar un medicamento sustituto a menor costo o llenar un formulario para que el  seguro cubra el medicamento que se considera necesario.   Si se requiere una autorizacin previa para que su compaa de seguros cubra su medicamento, por favor permtanos de 1 a 2 das hbiles para completar este proceso.  Los precios de los medicamentos varan con frecuencia dependiendo del lugar de dnde se surte la receta y alguna farmacias pueden ofrecer precios ms baratos.  El sitio web www.goodrx.com tiene cupones para medicamentos de diferentes farmacias. Los precios aqu no tienen en cuenta lo que podra costar con la ayuda del seguro (puede ser ms barato con su seguro), pero el sitio web puede darle el precio si no utiliz ningn seguro.  - Puede imprimir el cupn correspondiente y llevarlo con su receta a la farmacia.  - Tambin puede pasar por nuestra oficina durante el horario de atencin regular y recoger una tarjeta de cupones de GoodRx.  - Si necesita que su receta se enve electrnicamente a una farmacia diferente, informe a nuestra oficina a travs de MyChart de [facility]   o por telfono llamando al [phone] y presione la opcin 4.  

## 2022-11-15 ENCOUNTER — Ambulatory Visit: Payer: Medicare PPO | Admitting: Dermatology

## 2023-01-22 ENCOUNTER — Ambulatory Visit: Payer: Medicare Other | Admitting: Dermatology

## 2023-08-28 ENCOUNTER — Ambulatory Visit: Payer: Medicare PPO | Admitting: Dermatology
